# Patient Record
Sex: Male | Born: 1955 | Race: White | Hispanic: No | Marital: Married | Smoking: Former smoker
Health system: Southern US, Community
[De-identification: ages and names within clinical notes are randomized; demographics above are authoritative.]

---

## 1998-07-13 ENCOUNTER — Emergency Department (HOSPITAL_COMMUNITY): Admission: EM | Admit: 1998-07-13 | Discharge: 1998-07-13 | Payer: Self-pay | Admitting: Emergency Medicine

## 1998-07-13 ENCOUNTER — Ambulatory Visit (HOSPITAL_COMMUNITY): Admission: RE | Admit: 1998-07-13 | Discharge: 1998-07-13 | Payer: Self-pay | Admitting: Urology

## 1998-07-13 ENCOUNTER — Encounter: Payer: Self-pay | Admitting: Urology

## 2019-12-30 ENCOUNTER — Other Ambulatory Visit: Payer: Self-pay | Admitting: Physician Assistant

## 2019-12-30 DIAGNOSIS — R1031 Right lower quadrant pain: Secondary | ICD-10-CM

## 2020-01-03 ENCOUNTER — Ambulatory Visit
Admission: RE | Admit: 2020-01-03 | Discharge: 2020-01-03 | Disposition: A | Discharge status: Home / Self Care | Payer: Medicare Other | Source: Ambulatory Visit | Attending: Physician Assistant | Admitting: Physician Assistant

## 2020-01-03 ENCOUNTER — Other Ambulatory Visit: Payer: Self-pay

## 2020-01-03 DIAGNOSIS — R1031 Right lower quadrant pain: Secondary | ICD-10-CM

## 2020-01-09 ENCOUNTER — Other Ambulatory Visit: Payer: Self-pay

## 2020-03-03 DIAGNOSIS — R0600 Dyspnea, unspecified: Secondary | ICD-10-CM | POA: Diagnosis not present

## 2020-03-03 DIAGNOSIS — R748 Abnormal levels of other serum enzymes: Secondary | ICD-10-CM | POA: Diagnosis not present

## 2020-03-04 ENCOUNTER — Other Ambulatory Visit: Payer: Self-pay | Admitting: Family Medicine

## 2020-03-04 ENCOUNTER — Ambulatory Visit
Admission: RE | Admit: 2020-03-04 | Discharge: 2020-03-04 | Disposition: A | Discharge status: Home / Self Care | Payer: Medicare Other | Source: Ambulatory Visit | Attending: Family Medicine | Admitting: Family Medicine

## 2020-03-04 DIAGNOSIS — R0600 Dyspnea, unspecified: Secondary | ICD-10-CM

## 2020-03-04 DIAGNOSIS — R0602 Shortness of breath: Secondary | ICD-10-CM | POA: Diagnosis not present

## 2020-03-16 DIAGNOSIS — R0600 Dyspnea, unspecified: Secondary | ICD-10-CM | POA: Diagnosis not present

## 2020-03-25 DIAGNOSIS — R0602 Shortness of breath: Secondary | ICD-10-CM | POA: Diagnosis not present

## 2020-03-25 DIAGNOSIS — R609 Edema, unspecified: Secondary | ICD-10-CM | POA: Diagnosis not present

## 2020-06-09 DIAGNOSIS — Z0000 Encounter for general adult medical examination without abnormal findings: Secondary | ICD-10-CM | POA: Diagnosis not present

## 2020-06-09 DIAGNOSIS — K219 Gastro-esophageal reflux disease without esophagitis: Secondary | ICD-10-CM | POA: Diagnosis not present

## 2020-06-09 DIAGNOSIS — M109 Gout, unspecified: Secondary | ICD-10-CM | POA: Diagnosis not present

## 2020-06-09 DIAGNOSIS — I129 Hypertensive chronic kidney disease with stage 1 through stage 4 chronic kidney disease, or unspecified chronic kidney disease: Secondary | ICD-10-CM | POA: Diagnosis not present

## 2020-06-30 DIAGNOSIS — N1831 Chronic kidney disease, stage 3a: Secondary | ICD-10-CM | POA: Diagnosis not present

## 2020-07-20 ENCOUNTER — Other Ambulatory Visit: Payer: Self-pay | Admitting: Family Medicine

## 2020-07-20 DIAGNOSIS — Z136 Encounter for screening for cardiovascular disorders: Secondary | ICD-10-CM

## 2020-07-23 DIAGNOSIS — N1831 Chronic kidney disease, stage 3a: Secondary | ICD-10-CM | POA: Diagnosis not present

## 2020-08-05 ENCOUNTER — Ambulatory Visit
Admission: RE | Admit: 2020-08-05 | Discharge: 2020-08-05 | Disposition: A | Discharge status: Home / Self Care | Payer: Medicare Other | Source: Ambulatory Visit | Attending: Family Medicine | Admitting: Family Medicine

## 2020-08-05 DIAGNOSIS — Z136 Encounter for screening for cardiovascular disorders: Secondary | ICD-10-CM

## 2020-08-26 DIAGNOSIS — K149 Disease of tongue, unspecified: Secondary | ICD-10-CM | POA: Diagnosis not present

## 2020-12-29 ENCOUNTER — Other Ambulatory Visit: Payer: Self-pay | Admitting: Family Medicine

## 2020-12-29 DIAGNOSIS — R911 Solitary pulmonary nodule: Secondary | ICD-10-CM

## 2021-01-11 ENCOUNTER — Other Ambulatory Visit: Payer: Self-pay

## 2021-01-11 ENCOUNTER — Ambulatory Visit
Admission: RE | Admit: 2021-01-11 | Discharge: 2021-01-11 | Disposition: A | Discharge status: Home / Self Care | Payer: Medicare Other | Source: Ambulatory Visit | Attending: Family Medicine | Admitting: Family Medicine

## 2021-01-11 DIAGNOSIS — R911 Solitary pulmonary nodule: Secondary | ICD-10-CM

## 2021-01-11 MED ORDER — IOPAMIDOL (ISOVUE-300) INJECTION 61%
75.0000 mL | Freq: Once | INTRAVENOUS | Status: AC | PRN
  Administered 2021-01-11: 75 mL via INTRAVENOUS

## 2021-02-02 DIAGNOSIS — N1831 Chronic kidney disease, stage 3a: Secondary | ICD-10-CM | POA: Diagnosis not present

## 2021-06-16 DIAGNOSIS — Z0000 Encounter for general adult medical examination without abnormal findings: Secondary | ICD-10-CM | POA: Diagnosis not present

## 2021-06-16 DIAGNOSIS — M109 Gout, unspecified: Secondary | ICD-10-CM | POA: Diagnosis not present

## 2021-06-16 DIAGNOSIS — I129 Hypertensive chronic kidney disease with stage 1 through stage 4 chronic kidney disease, or unspecified chronic kidney disease: Secondary | ICD-10-CM | POA: Diagnosis not present

## 2021-06-16 DIAGNOSIS — K219 Gastro-esophageal reflux disease without esophagitis: Secondary | ICD-10-CM | POA: Diagnosis not present

## 2021-06-16 DIAGNOSIS — Z125 Encounter for screening for malignant neoplasm of prostate: Secondary | ICD-10-CM | POA: Diagnosis not present

## 2021-06-30 DIAGNOSIS — R739 Hyperglycemia, unspecified: Secondary | ICD-10-CM | POA: Diagnosis not present

## 2021-07-26 DIAGNOSIS — M109 Gout, unspecified: Secondary | ICD-10-CM | POA: Diagnosis not present

## 2021-08-05 ENCOUNTER — Other Ambulatory Visit: Payer: Self-pay | Admitting: *Deleted

## 2021-08-05 DIAGNOSIS — J439 Emphysema, unspecified: Secondary | ICD-10-CM

## 2021-08-06 ENCOUNTER — Ambulatory Visit (INDEPENDENT_AMBULATORY_CARE_PROVIDER_SITE_OTHER): Payer: Medicare Other | Admitting: Internal Medicine

## 2021-08-06 ENCOUNTER — Other Ambulatory Visit: Payer: Self-pay

## 2021-08-06 DIAGNOSIS — J439 Emphysema, unspecified: Secondary | ICD-10-CM | POA: Diagnosis not present

## 2021-08-06 LAB — PULMONARY FUNCTION TEST
DL/VA % pred: 93 %
DL/VA: 3.81 ml/min/mmHg/L
DLCO cor % pred: 92 %
DLCO cor: 25.07 ml/min/mmHg
DLCO unc % pred: 92 %
DLCO unc: 25.07 ml/min/mmHg
FEF 25-75 Post: 2.15 L/sec
FEF 25-75 Pre: 2.34 L/sec
FEF2575-%Change-Post: -8 %
FEF2575-%Pred-Post: 79 %
FEF2575-%Pred-Pre: 86 %
FEV1-%Change-Post: 0 %
FEV1-%Pred-Post: 86 %
FEV1-%Pred-Pre: 87 %
FEV1-Post: 3.03 L
FEV1-Pre: 3.06 L
FEV1FVC-%Change-Post: 1 %
FEV1FVC-%Pred-Pre: 97 %
FEV6-%Change-Post: -4 %
FEV6-%Pred-Post: 90 %
FEV6-%Pred-Pre: 94 %
FEV6-Post: 4.03 L
FEV6-Pre: 4.21 L
FEV6FVC-%Change-Post: 0 %
FEV6FVC-%Pred-Post: 105 %
FEV6FVC-%Pred-Pre: 104 %
FVC-%Change-Post: -2 %
FVC-%Pred-Post: 87 %
FVC-%Pred-Pre: 90 %
FVC-Post: 4.12 L
FVC-Pre: 4.24 L
Post FEV1/FVC ratio: 74 %
Post FEV6/FVC ratio: 100 %
Pre FEV1/FVC ratio: 72 %
Pre FEV6/FVC Ratio: 99 %
RV % pred: 108 %
RV: 2.64 L
TLC % pred: 99 %
TLC: 7.16 L

## 2021-08-06 NOTE — Patient Instructions (Signed)
Full PFT performed today. °

## 2021-08-06 NOTE — Progress Notes (Signed)
Full PFT performed today. °

## 2021-08-12 DIAGNOSIS — R972 Elevated prostate specific antigen [PSA]: Secondary | ICD-10-CM | POA: Diagnosis not present

## 2021-08-12 DIAGNOSIS — N400 Enlarged prostate without lower urinary tract symptoms: Secondary | ICD-10-CM | POA: Diagnosis not present

## 2021-11-01 DIAGNOSIS — M25511 Pain in right shoulder: Secondary | ICD-10-CM | POA: Diagnosis not present

## 2021-11-19 DIAGNOSIS — J040 Acute laryngitis: Secondary | ICD-10-CM | POA: Diagnosis not present

## 2021-11-19 DIAGNOSIS — J019 Acute sinusitis, unspecified: Secondary | ICD-10-CM | POA: Diagnosis not present

## 2021-11-19 DIAGNOSIS — J029 Acute pharyngitis, unspecified: Secondary | ICD-10-CM | POA: Diagnosis not present

## 2021-11-19 DIAGNOSIS — J40 Bronchitis, not specified as acute or chronic: Secondary | ICD-10-CM | POA: Diagnosis not present

## 2021-11-30 DIAGNOSIS — R739 Hyperglycemia, unspecified: Secondary | ICD-10-CM | POA: Diagnosis not present

## 2021-12-16 IMAGING — CT CT CHEST W/ CM
1 series · 15 of 34 positions shown, 19 images · IV contrast (APPLIED)
Comparison: CT abdomen pelvis 01/03/2020.

CLINICAL DATA: Lung nodule.

EXAM:
CT CHEST WITH CONTRAST
TECHNIQUE: Multidetector CT imaging of the chest was performed during
intravenous contrast administration.
CONTRAST:  75mL [id number] IOPAMIDOL ([id number]) INJECTION 61%
Creatinine was obtained on site at [HOSPITAL] at [HOSPITAL].
Results: Creatinine 1.3 mg/dL.

[Series 2: chest w/cm · axial · 0.79mm/px · z∈[-374,-54]mm · 15 of 188 slices shown, 19 images]
[im 14/188  mediastinal]
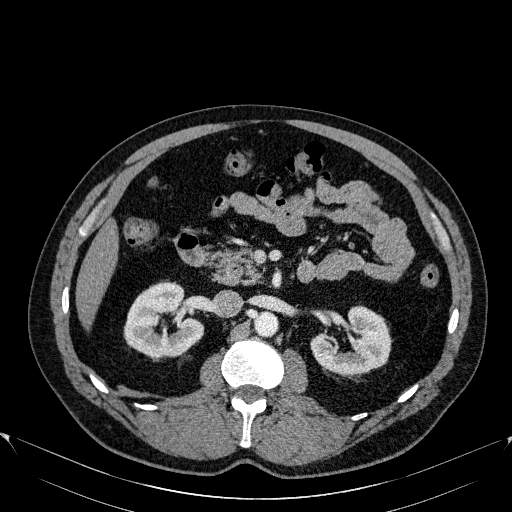
[im 14/188  lung]
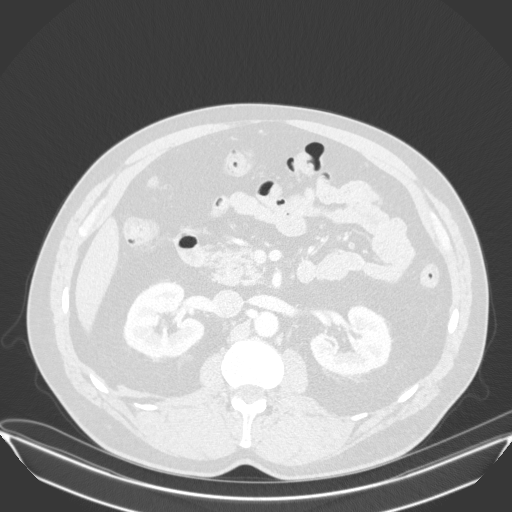
[im 28/188  lung]
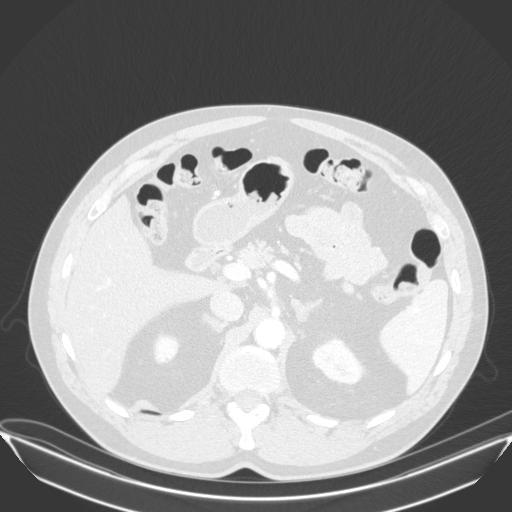
[im 38/188  lung]
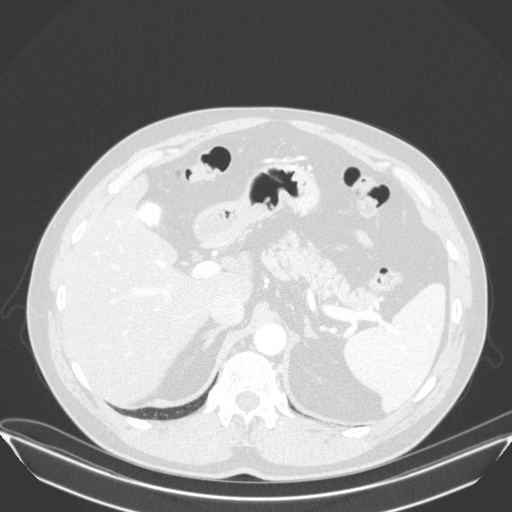
[im 49/188  lung]
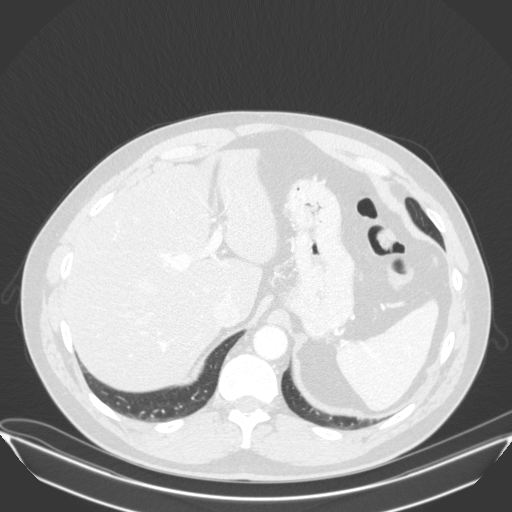
[im 63/188  mediastinal]
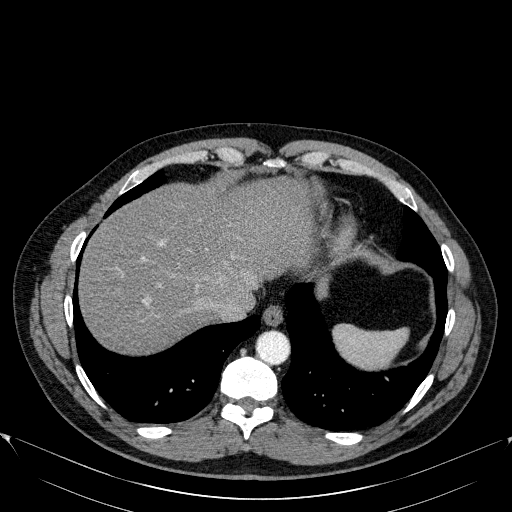
[im 63/188  lung]
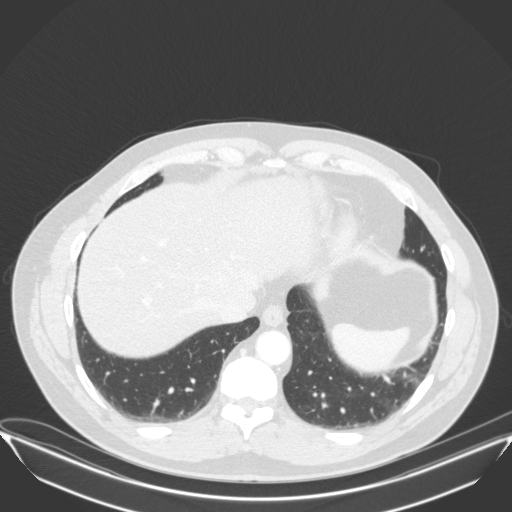
[im 75/188  lung]
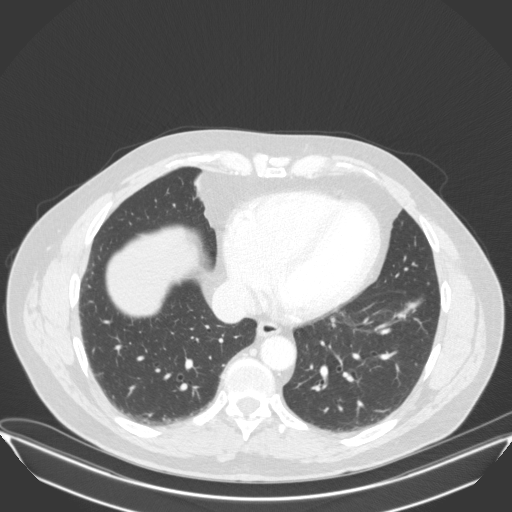
[im 84/188  lung]
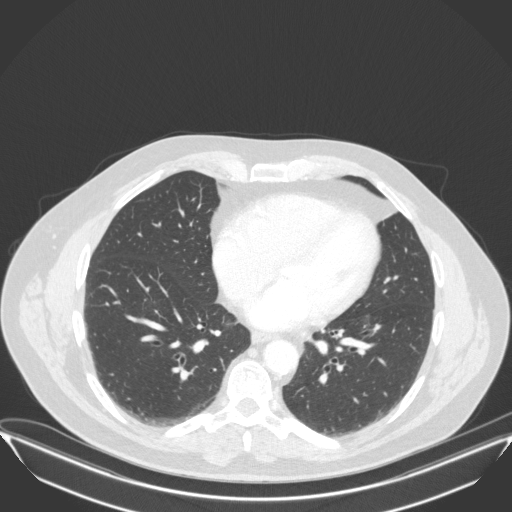
[im 97/188  lung]
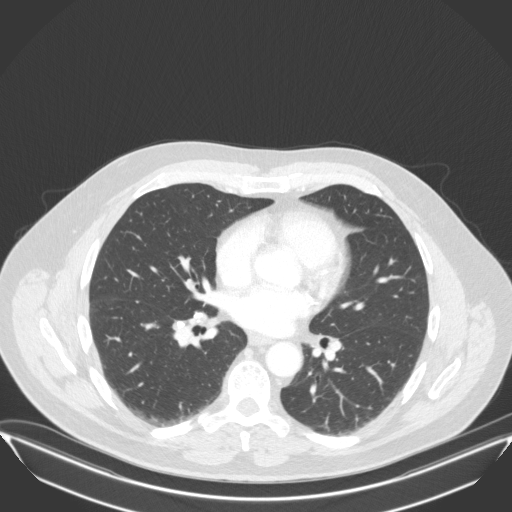
[im 104/188  mediastinal]
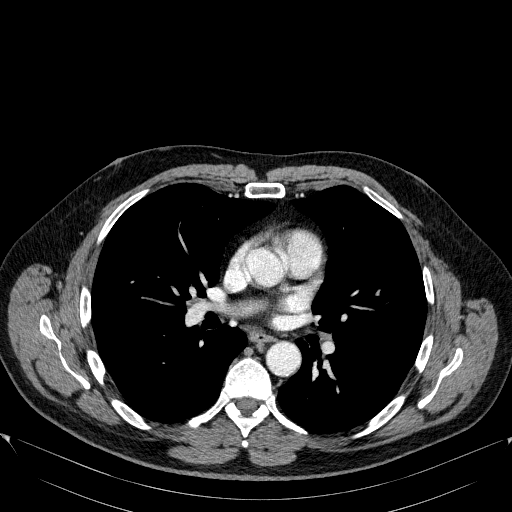
[im 104/188  lung]
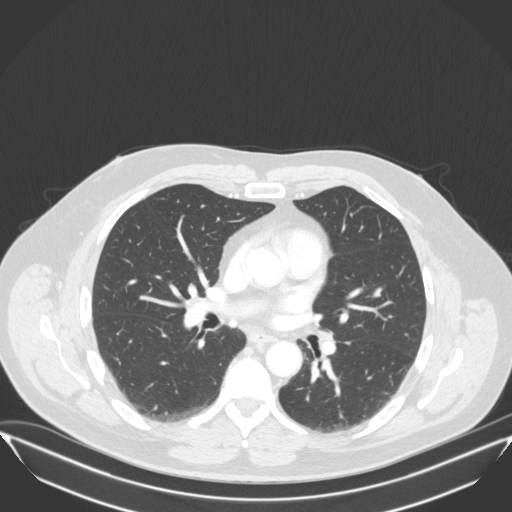
[im 113/188  lung]
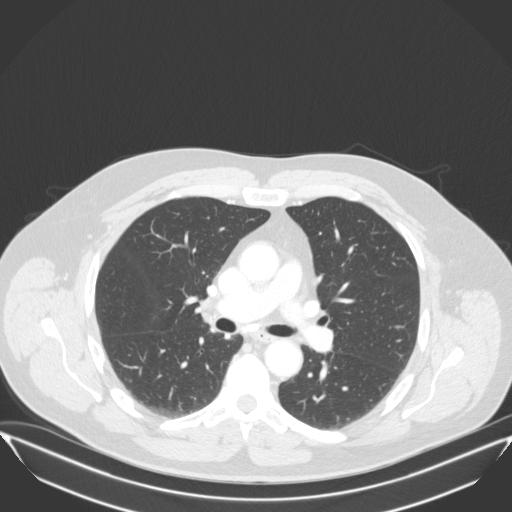
[im 125/188  lung]
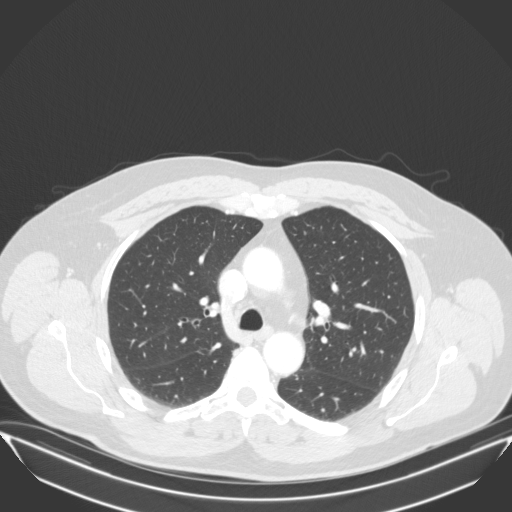
[im 139/188  lung]
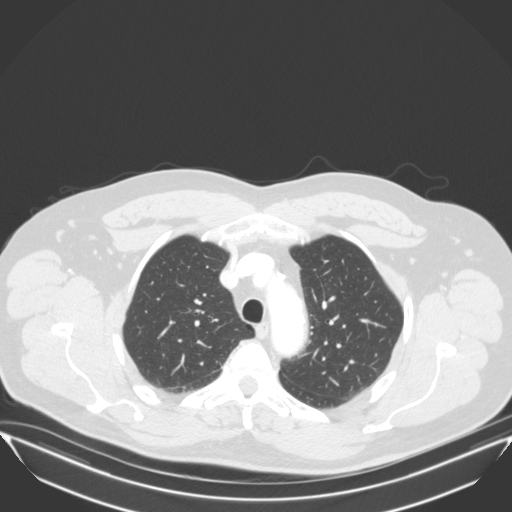
[im 150/188  mediastinal]
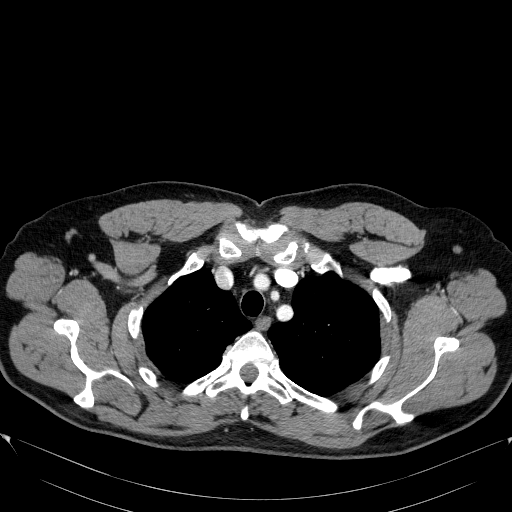
[im 150/188  lung]
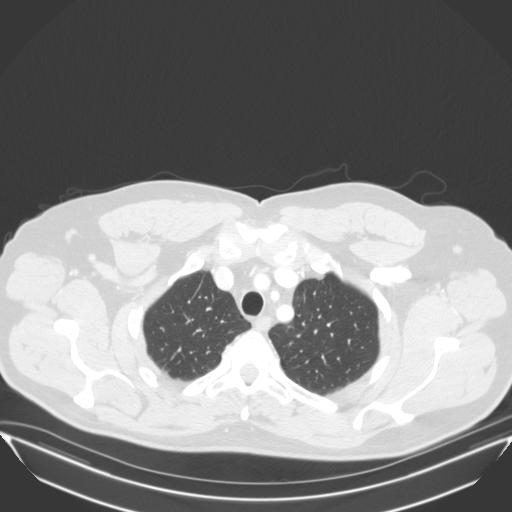
[im 160/188  lung]
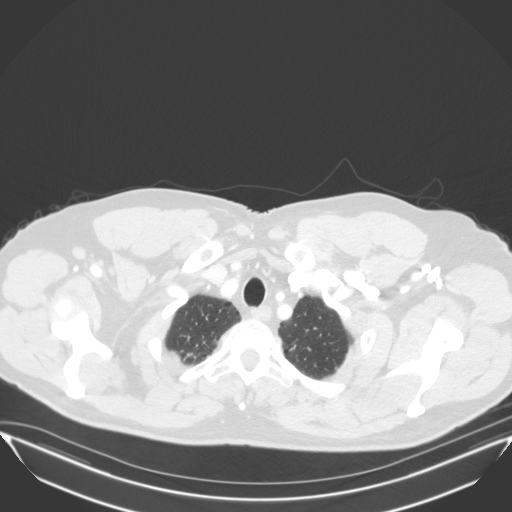
[im 174/188  lung]
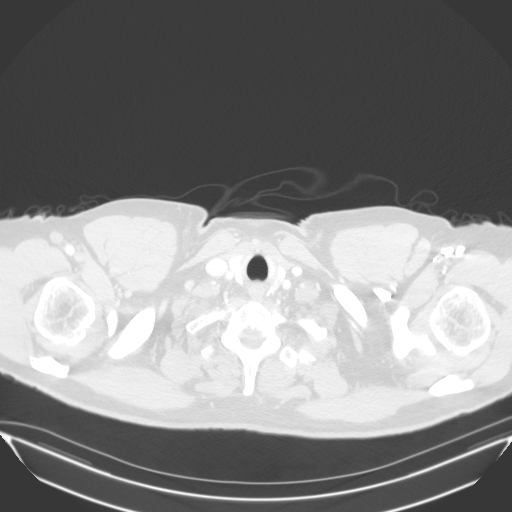

[15 of 34 positions shown; findings below may reference images not displayed]

FINDINGS: Cardiovascular: Atherosclerotic calcification of the aorta and
coronary arteries. Heart size normal. No pericardial effusion.

Mediastinum/Nodes: No pathologically enlarged mediastinal or hilar
lymph nodes. Calcified mediastinal and hilar lymph nodes. No
axillary adenopathy. Esophagus is grossly unremarkable.

Lungs/Pleura: Biapical pleuroparenchymal scarring. Mild paraseptal
emphysema. Calcified granulomas. 5 mm right middle lobe nodule
(5/102), unchanged and benign. Linear scarring in the left lower
lobe. Mild dependent atelectasis bilaterally. Lungs are otherwise
clear. No pleural fluid. Airway is unremarkable.

Upper Abdomen: Liver is decreased in attenuation diffusely. Stones
are seen in the gallbladder. Adrenal glands and visualized portion
of the right kidney are unremarkable. Subcentimeter low-attenuation
lesion in the left kidney is too small to definitively characterize
but statistically, a cyst is likely. Spleen and visualized portions
of the pancreas, stomach and bowel are grossly unremarkable.

Musculoskeletal: None.
IMPRESSION: 1. 5 mm right middle lobe nodule, unchanged and benign.
2. Hepatic steatosis.
3. Aortic atherosclerosis (8DGMS-[id number].F). Coronary artery
calcification.
4.  Emphysema ([id number].A).

## 2022-01-03 DIAGNOSIS — M109 Gout, unspecified: Secondary | ICD-10-CM | POA: Diagnosis not present

## 2022-01-03 DIAGNOSIS — R7303 Prediabetes: Secondary | ICD-10-CM | POA: Diagnosis not present

## 2022-01-03 DIAGNOSIS — K219 Gastro-esophageal reflux disease without esophagitis: Secondary | ICD-10-CM | POA: Diagnosis not present

## 2022-01-03 DIAGNOSIS — I129 Hypertensive chronic kidney disease with stage 1 through stage 4 chronic kidney disease, or unspecified chronic kidney disease: Secondary | ICD-10-CM | POA: Diagnosis not present

## 2022-02-10 DIAGNOSIS — R972 Elevated prostate specific antigen [PSA]: Secondary | ICD-10-CM | POA: Diagnosis not present

## 2022-02-17 DIAGNOSIS — R972 Elevated prostate specific antigen [PSA]: Secondary | ICD-10-CM | POA: Diagnosis not present

## 2022-02-17 DIAGNOSIS — Z125 Encounter for screening for malignant neoplasm of prostate: Secondary | ICD-10-CM | POA: Diagnosis not present

## 2022-02-17 DIAGNOSIS — N400 Enlarged prostate without lower urinary tract symptoms: Secondary | ICD-10-CM | POA: Diagnosis not present

## 2022-06-14 DIAGNOSIS — L821 Other seborrheic keratosis: Secondary | ICD-10-CM | POA: Diagnosis not present

## 2022-06-14 DIAGNOSIS — D2262 Melanocytic nevi of left upper limb, including shoulder: Secondary | ICD-10-CM | POA: Diagnosis not present

## 2022-06-14 DIAGNOSIS — D2261 Melanocytic nevi of right upper limb, including shoulder: Secondary | ICD-10-CM | POA: Diagnosis not present

## 2022-06-14 DIAGNOSIS — D225 Melanocytic nevi of trunk: Secondary | ICD-10-CM | POA: Diagnosis not present

## 2022-07-26 DIAGNOSIS — Z0000 Encounter for general adult medical examination without abnormal findings: Secondary | ICD-10-CM | POA: Diagnosis not present

## 2022-07-26 DIAGNOSIS — M109 Gout, unspecified: Secondary | ICD-10-CM | POA: Diagnosis not present

## 2022-07-26 DIAGNOSIS — I129 Hypertensive chronic kidney disease with stage 1 through stage 4 chronic kidney disease, or unspecified chronic kidney disease: Secondary | ICD-10-CM | POA: Diagnosis not present

## 2022-07-26 DIAGNOSIS — R7303 Prediabetes: Secondary | ICD-10-CM | POA: Diagnosis not present

## 2022-07-26 DIAGNOSIS — K219 Gastro-esophageal reflux disease without esophagitis: Secondary | ICD-10-CM | POA: Diagnosis not present

## 2022-11-14 DIAGNOSIS — M109 Gout, unspecified: Secondary | ICD-10-CM | POA: Diagnosis not present

## 2022-11-14 DIAGNOSIS — E7800 Pure hypercholesterolemia, unspecified: Secondary | ICD-10-CM | POA: Diagnosis not present

## 2023-01-25 DIAGNOSIS — I700 Atherosclerosis of aorta: Secondary | ICD-10-CM | POA: Diagnosis not present

## 2023-01-25 DIAGNOSIS — I10 Essential (primary) hypertension: Secondary | ICD-10-CM | POA: Diagnosis not present

## 2023-01-25 DIAGNOSIS — R7303 Prediabetes: Secondary | ICD-10-CM | POA: Diagnosis not present

## 2023-02-13 DIAGNOSIS — R972 Elevated prostate specific antigen [PSA]: Secondary | ICD-10-CM | POA: Diagnosis not present

## 2023-02-20 DIAGNOSIS — N400 Enlarged prostate without lower urinary tract symptoms: Secondary | ICD-10-CM | POA: Diagnosis not present

## 2023-08-09 DIAGNOSIS — I700 Atherosclerosis of aorta: Secondary | ICD-10-CM | POA: Diagnosis not present

## 2023-08-09 DIAGNOSIS — M109 Gout, unspecified: Secondary | ICD-10-CM | POA: Diagnosis not present

## 2023-08-09 DIAGNOSIS — Z1331 Encounter for screening for depression: Secondary | ICD-10-CM | POA: Diagnosis not present

## 2023-08-09 DIAGNOSIS — R7303 Prediabetes: Secondary | ICD-10-CM | POA: Diagnosis not present

## 2023-08-09 DIAGNOSIS — Z0000 Encounter for general adult medical examination without abnormal findings: Secondary | ICD-10-CM | POA: Diagnosis not present

## 2023-08-09 DIAGNOSIS — J439 Emphysema, unspecified: Secondary | ICD-10-CM | POA: Diagnosis not present

## 2023-08-09 DIAGNOSIS — N1831 Chronic kidney disease, stage 3a: Secondary | ICD-10-CM | POA: Diagnosis not present

## 2023-08-09 DIAGNOSIS — I129 Hypertensive chronic kidney disease with stage 1 through stage 4 chronic kidney disease, or unspecified chronic kidney disease: Secondary | ICD-10-CM | POA: Diagnosis not present

## 2023-08-21 DIAGNOSIS — R0609 Other forms of dyspnea: Secondary | ICD-10-CM | POA: Diagnosis not present

## 2023-10-30 DIAGNOSIS — J439 Emphysema, unspecified: Secondary | ICD-10-CM | POA: Diagnosis not present

## 2023-10-30 DIAGNOSIS — N1831 Chronic kidney disease, stage 3a: Secondary | ICD-10-CM | POA: Diagnosis not present

## 2023-10-30 DIAGNOSIS — M25551 Pain in right hip: Secondary | ICD-10-CM | POA: Diagnosis not present

## 2024-01-17 DIAGNOSIS — N1831 Chronic kidney disease, stage 3a: Secondary | ICD-10-CM | POA: Diagnosis not present

## 2024-01-17 DIAGNOSIS — M25551 Pain in right hip: Secondary | ICD-10-CM | POA: Diagnosis not present

## 2024-01-17 DIAGNOSIS — M7071 Other bursitis of hip, right hip: Secondary | ICD-10-CM | POA: Diagnosis not present

## 2024-02-15 DIAGNOSIS — H53142 Visual discomfort, left eye: Secondary | ICD-10-CM | POA: Diagnosis not present

## 2024-02-19 DIAGNOSIS — H539 Unspecified visual disturbance: Secondary | ICD-10-CM | POA: Diagnosis not present

## 2024-02-19 DIAGNOSIS — R519 Headache, unspecified: Secondary | ICD-10-CM | POA: Diagnosis not present

## 2024-02-19 DIAGNOSIS — I129 Hypertensive chronic kidney disease with stage 1 through stage 4 chronic kidney disease, or unspecified chronic kidney disease: Secondary | ICD-10-CM | POA: Diagnosis not present

## 2024-02-19 DIAGNOSIS — N1831 Chronic kidney disease, stage 3a: Secondary | ICD-10-CM | POA: Diagnosis not present

## 2024-02-19 DIAGNOSIS — R7303 Prediabetes: Secondary | ICD-10-CM | POA: Diagnosis not present

## 2024-02-20 ENCOUNTER — Encounter: Payer: Self-pay | Admitting: Family Medicine

## 2024-02-20 ENCOUNTER — Other Ambulatory Visit: Payer: Self-pay | Admitting: Family Medicine

## 2024-02-20 DIAGNOSIS — H539 Unspecified visual disturbance: Secondary | ICD-10-CM

## 2024-02-20 DIAGNOSIS — R519 Headache, unspecified: Secondary | ICD-10-CM

## 2024-02-22 ENCOUNTER — Ambulatory Visit
Admission: RE | Admit: 2024-02-22 | Discharge: 2024-02-22 | Disposition: A | Discharge status: Home / Self Care | Source: Ambulatory Visit | Attending: Family Medicine | Admitting: Family Medicine

## 2024-02-22 DIAGNOSIS — I6523 Occlusion and stenosis of bilateral carotid arteries: Secondary | ICD-10-CM | POA: Diagnosis not present

## 2024-02-22 DIAGNOSIS — H539 Unspecified visual disturbance: Secondary | ICD-10-CM

## 2024-02-27 ENCOUNTER — Encounter: Payer: Self-pay | Admitting: Family Medicine

## 2024-02-28 ENCOUNTER — Encounter: Payer: Self-pay | Admitting: Neurology

## 2024-03-08 DIAGNOSIS — N400 Enlarged prostate without lower urinary tract symptoms: Secondary | ICD-10-CM | POA: Diagnosis not present

## 2024-03-11 ENCOUNTER — Ambulatory Visit
Admission: RE | Admit: 2024-03-11 | Discharge: 2024-03-11 | Disposition: A | Discharge status: Home / Self Care | Source: Ambulatory Visit | Attending: Family Medicine | Admitting: Family Medicine

## 2024-03-11 DIAGNOSIS — R42 Dizziness and giddiness: Secondary | ICD-10-CM | POA: Diagnosis not present

## 2024-03-11 DIAGNOSIS — H539 Unspecified visual disturbance: Secondary | ICD-10-CM | POA: Diagnosis not present

## 2024-03-11 DIAGNOSIS — R519 Headache, unspecified: Secondary | ICD-10-CM

## 2024-03-11 MED ORDER — GADOPICLENOL 0.5 MMOL/ML IV SOLN
9.0000 mL | Freq: Once | INTRAVENOUS | Status: AC | PRN
  Administered 2024-03-11: 9 mL via INTRAVENOUS

## 2024-03-18 DIAGNOSIS — R748 Abnormal levels of other serum enzymes: Secondary | ICD-10-CM | POA: Diagnosis not present

## 2024-05-05 NOTE — Progress Notes (Signed)
 NEUROLOGY CONSULTATION NOTE  RC AMISON MRN: [id number] DOB: 11/09/1955  Referring provider: Vyvyan Sun, MD Primary care provider: Lamar Quarry, MD  Reason for consult:  headache  Assessment/Plan:   Photosensitivity - I think that the headache is a symptom of the photosensitivity rather than a headache syndrome causing the visual disturbance.  MRI of brain unremarkable.  I would just recommend continuing to take precautions using sunglasses when outside in the head and bright sunlight.   Subjective:  Dylan Cooke is a 67 year old right-handed male with HTN, CKD, gout, fatty liver disease, GERD, prediabetes, anxiety and history of kidney stone who presents for headache.  History supplemented by referring provider's note.  MRI of brain personally reviewed.  Symptoms started about 2 years ago.  When he is outside in the head under bright sun, he starts exhibiting fluctuating white out of vision off and on throughout the day.  He will then develop a dull 3-4/10 non-throbbing pressure headache in the lateral neck bilaterally and radiating up behind the ears to the temples and across the forehead and behind the eyes.  No associated nausea, phonophobia or unilateral numbness or weakness.  Symptoms last as long as he is exposed to the glare and heat.  He often plays golf and when he wears his sunglasses, these symptoms can be avoided.  He otherwise denies neck pain.  Separately, he may feel briefly lightheaded with change in position, specifically if he is bent over to place his golf ball and then stands upright.  No spinning.    He saw his eye doctor and had an unremarkable exam.  Bilateral carotid ultrasound on 02/22/2024 showed bilateral carotid atherosclerosis with less than 50% bilateral carotid artery stenosis.  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    Denies history of  headaches.  He had spinal meningitis when he was a baby.  Past NSAIDS/analgesics:  meloxicam Past abortive triptans:  none Past abortive ergotamine:  none Past muscle relaxants:  none Past anti-emetic:  none Past antihypertensive medications:  none Past antidepressant medications:  none Past anticonvulsant medications:  none Past anti-CGRP:  none Other past therapies:  none  Current NSAIDS/analgesics:  Tylenol Current triptans:  none Current ergotamine:  none Current anti-emetic:  none Current muscle relaxants:  none Current Antihypertensive medications:  olmesartan 40mg  daily Current Antidepressant medications:  none Current Anticonvulsant medications:  none Current anti-CGRP:  none Current Vitamins/Herbal/Supplements:  none Current Antihistamines/Decongestants:  none Other therapy:  none Other medications:  allopurinol, albuterol     PAST MEDICAL HISTORY: History reviewed. No pertinent past medical history.  PAST SURGICAL HISTORY: History reviewed. No pertinent surgical history.  MEDICATIONS: Current Outpatient Medications on File Prior to Visit  Medication Sig Dispense Refill   allopurinol (ZYLOPRIM) 300 MG tablet 300 mg.     olmesartan (BENICAR) 40 MG tablet Take 40 mg by mouth daily.     pantoprazole (PROTONIX) 40 MG tablet Take 40 mg by mouth daily. (Patient taking differently: Take 40 mg by mouth daily. Takes twice a week)     No current facility-administered medications on file prior to visit.     ALLERGIES: Allergies  Allergen Reactions   Sulfamethoxazole Other (See Comments)    FAMILY HISTORY: History reviewed. No pertinent family history.  Objective:  Blood pressure 128/74, pulse 64, resp. rate 18, height 5' 10.5 (1.791 m), weight 197 lb (89.4 kg),  SpO2 95%. General: No acute distress.  Patient appears well-groomed.   Head:  Normocephalic/atraumatic Eyes:  fundi examined but not visualized Neck: supple, no paraspinal tenderness, full range of  motion Heart: regular rate and rhythm Neurological Exam: Mental status: alert and oriented to person, place, and time, speech fluent and not dysarthric, language intact. Cranial nerves: CN I: not tested CN II: pupils equal, round and reactive to light, visual fields intact CN III, IV, VI:  full range of motion, no nystagmus, no ptosis CN V: facial sensation intact. CN VII: upper and lower face symmetric CN VIII: hearing intact CN IX, X: gag intact, uvula midline CN XI: sternocleidomastoid and trapezius muscles intact CN XII: tongue midline Bulk & Tone: normal, no fasciculations. Motor:  muscle strength 5/5 throughout Sensation:  Pinprick and vibratory sensation intact. Deep Tendon Reflexes:  2+ throughout,  toes downgoing.   Finger to nose testing:  Without dysmetria.   Gait:  Normal station and stride.  Romberg negative.    Thank you for allowing me to take part in the care of this patient.  Juliene Dunnings, DO  CC:  Vyvyan Sun, MD

## 2024-05-06 ENCOUNTER — Ambulatory Visit (INDEPENDENT_AMBULATORY_CARE_PROVIDER_SITE_OTHER): Admitting: Neurology

## 2024-05-06 ENCOUNTER — Encounter: Payer: Self-pay | Admitting: Neurology

## 2024-05-06 VITALS — BP 128/74 | HR 64 | Resp 18 | Ht 70.5 in | Wt 197.0 lb

## 2024-05-06 DIAGNOSIS — L568 Other specified acute skin changes due to ultraviolet radiation: Secondary | ICD-10-CM | POA: Diagnosis not present

## 2024-07-26 DIAGNOSIS — R7303 Prediabetes: Secondary | ICD-10-CM | POA: Diagnosis not present

## 2024-07-26 DIAGNOSIS — Z0000 Encounter for general adult medical examination without abnormal findings: Secondary | ICD-10-CM | POA: Diagnosis not present

## 2024-07-26 DIAGNOSIS — I129 Hypertensive chronic kidney disease with stage 1 through stage 4 chronic kidney disease, or unspecified chronic kidney disease: Secondary | ICD-10-CM | POA: Diagnosis not present

## 2024-07-26 DIAGNOSIS — M109 Gout, unspecified: Secondary | ICD-10-CM | POA: Diagnosis not present

## 2024-07-26 DIAGNOSIS — Z1331 Encounter for screening for depression: Secondary | ICD-10-CM | POA: Diagnosis not present

## 2024-07-26 DIAGNOSIS — N1831 Chronic kidney disease, stage 3a: Secondary | ICD-10-CM | POA: Diagnosis not present
